# Patient Record
Sex: Female | Born: 1965 | Race: Black or African American | Hispanic: No | Marital: Married
Health system: Southern US, Community
[De-identification: ages and names within clinical notes are randomized; demographics above are authoritative.]

## PROBLEM LIST (undated history)

## (undated) DIAGNOSIS — I10 Essential (primary) hypertension: Secondary | ICD-10-CM

## (undated) DIAGNOSIS — I471 Supraventricular tachycardia, unspecified: Secondary | ICD-10-CM

## (undated) DIAGNOSIS — I82A19 Acute embolism and thrombosis of unspecified axillary vein: Secondary | ICD-10-CM

---

## 2020-03-13 ENCOUNTER — Other Ambulatory Visit: Payer: Self-pay

## 2020-03-13 ENCOUNTER — Emergency Department: Payer: Managed Care, Other (non HMO)

## 2020-03-13 ENCOUNTER — Emergency Department
Admission: EM | Admit: 2020-03-13 | Discharge: 2020-03-13 | Disposition: A | Discharge status: Home / Self Care | Payer: Managed Care, Other (non HMO) | Attending: Emergency Medicine | Admitting: Emergency Medicine

## 2020-03-13 ENCOUNTER — Ambulatory Visit
Admission: EM | Admit: 2020-03-13 | Discharge: 2020-03-13 | Discharge status: Absent without leave | Payer: Managed Care, Other (non HMO) | Source: Home / Self Care

## 2020-03-13 DIAGNOSIS — M79662 Pain in left lower leg: Secondary | ICD-10-CM

## 2020-03-13 DIAGNOSIS — L03116 Cellulitis of left lower limb: Secondary | ICD-10-CM | POA: Diagnosis not present

## 2020-03-13 DIAGNOSIS — I10 Essential (primary) hypertension: Secondary | ICD-10-CM | POA: Insufficient documentation

## 2020-03-13 DIAGNOSIS — Z86718 Personal history of other venous thrombosis and embolism: Secondary | ICD-10-CM

## 2020-03-13 DIAGNOSIS — M7989 Other specified soft tissue disorders: Secondary | ICD-10-CM

## 2020-03-13 HISTORY — DX: Supraventricular tachycardia, unspecified: I47.10

## 2020-03-13 HISTORY — DX: Essential (primary) hypertension: I10

## 2020-03-13 HISTORY — DX: Supraventricular tachycardia: I47.1

## 2020-03-13 HISTORY — DX: Acute embolism and thrombosis of unspecified axillary vein: I82.A19

## 2020-03-13 MED ORDER — CEPHALEXIN 500 MG PO CAPS: 500.0000 mg | ORAL_CAPSULE | Freq: Three times a day (TID) | ORAL | 0 refills | Status: AC

## 2020-03-13 MED ORDER — IBUPROFEN 600 MG PO TABS
600.0000 mg | ORAL_TABLET | Freq: Four times a day (QID) | ORAL | 0 refills | Status: AC | PRN
Start: 2020-03-13 — End: ?

## 2020-03-13 NOTE — Discharge Instructions (Signed)
There is no evidence of a blood clot on your ultrasound.  Please take ibuprofen for inflammation and antibiotics for any possible infection.  Return to the emergency department in 1-2 days if symptoms worsen.

## 2020-03-13 NOTE — ED Triage Notes (Signed)
Pt here for left leg swelling that started a few days ago. Pt has a hx of blood clots and states that her leg is warm and tender to the touch and wanted to make sure that it was not a clot.

## 2020-03-13 NOTE — ED Provider Notes (Signed)
[facility] Emergency Department Provider Note  ____________________________________________  Time seen: Approximately 6:43 PM  I have reviewed the triage vital signs and the nursing notes.   HISTORY  Chief Complaint Leg Swelling    HPI Brittney Mills is a 54 y.o. female that presents to the emergency department for evaluation of left calf tenderness for 2 days.  Patient has a history of a DVT.  She is no longer on any anticoagulants.  Patient drove from [place] to [place] this week to be with her grandkids.  She does not smoke.  No recent surgeries.  Patient does not recall getting bitten by an insect.  No fevers.   Past Medical History:  Diagnosis Date  . DVT of axillary vein, acute ([place])   . Hypertension   . SVT (supraventricular tachycardia) ([facility])     There are no problems to display for this patient.   Prior to Admission medications   Medication Sig Start Date End Date Taking? Authorizing Provider  cephALEXin (KEFLEX) 500 MG capsule Take 1 capsule (500 mg total) by mouth 3 (three) times daily for 7 days. 03/13/20 03/20/20  Laban Emperor, PA-C  ibuprofen (ADVIL) 600 MG tablet Take 1 tablet (600 mg total) by mouth every 6 (six) hours as needed. 03/13/20   Laban Emperor, PA-C    Allergies Chocolate and Lisinopril  No family history on file.  Social History Social History   Tobacco Use  . Smoking status: Not on file  Substance Use Topics  . Alcohol use: Not on file  . Drug use: Not on file     Review of Systems  Constitutional: No fever/chills Cardiovascular: No chest pain. Respiratory: No cough. No SOB. Gastrointestinal: No abdominal pain.  No nausea, no vomiting.  Musculoskeletal: Positive for calf pain. Skin: Negative for abrasions, lacerations, ecchymosis.  Positive for rash. Neurological: Negative for headaches   ____________________________________________   PHYSICAL EXAM:  VITAL SIGNS: ED Triage Vitals  Enc  Vitals Group     BP 03/13/20 1616 (!) 161/96     Pulse Rate 03/13/20 1616 87     Resp 03/13/20 1616 16     Temp 03/13/20 1616 98 F (36.7 C)     Temp Source 03/13/20 1616 Oral     SpO2 03/13/20 1616 99 %     Weight 03/13/20 1616 220 lb (99.8 kg)     Height 03/13/20 1616 5\' 2"  (1.575 m)     Head Circumference --      Peak Flow --      Pain Score 03/13/20 1620 3     Pain Loc --      Pain Edu? --      Excl. in [place]? --      Constitutional: Alert and oriented. Well appearing and in no acute distress. Eyes: Conjunctivae are normal. PERRL. EOMI. Head: Atraumatic. ENT:      Ears:      Nose: No congestion/rhinnorhea.      Mouth/Throat: Mucous membranes are moist.  Neck: No stridor. Cardiovascular: Normal rate, regular rhythm.  Good peripheral circulation. Respiratory: Normal respiratory effort without tachypnea or retractions. Lungs CTAB. Good air entry to the bases with no decreased or absent breath sounds. Gastrointestinal: Bowel sounds 4 quadrants. Soft and nontender to palpation. No guarding or rigidity. No palpable masses. No distention. No CVA  Musculoskeletal: Full range of motion to all extremities. No gross deformities appreciated. Neurologic:  Normal speech and language. No gross focal neurologic deficits are appreciated.  Skin:  Skin is warm, dry and intact.  Mild tenderness and erythema to left proximal calf. Psychiatric: Mood and affect are normal. Speech and behavior are normal. Patient exhibits appropriate insight and judgement.   ____________________________________________   LABS (all labs ordered are listed, but only abnormal results are displayed)  Labs Reviewed - No data to display ____________________________________________  EKG   ____________________________________________  RADIOLOGY Lexine Baton, personally viewed and evaluated these images (plain radiographs) as part of my medical decision making, as well as reviewing the written report by the  radiologist.  US Venous Img Lower Unilateral Left (DVT)  Result Date: 03/13/2020 CLINICAL DATA:  Pain and swelling in LEFT leg for 2 days, history of deep venous thrombosis EXAM: LEFT LOWER EXTREMITY VENOUS DOPPLER ULTRASOUND TECHNIQUE: Gray-scale sonography with compression, as well as color and duplex ultrasound, were performed to evaluate the deep venous system(s) from the level of the common femoral vein through the popliteal and proximal calf veins. COMPARISON:  None FINDINGS: VENOUS Normal compressibility of the common femoral, superficial femoral, and popliteal veins, as well as the visualized calf veins. Visualized portions of profunda femoral vein and great saphenous vein unremarkable. No filling defects to suggest DVT on grayscale or color Doppler imaging. Doppler waveforms show normal direction of venous flow, normal respiratory plasticity and response to augmentation. Limited views of the contralateral common femoral vein are unremarkable. OTHER None. Limitations: none IMPRESSION: No evidence of deep venous thrombosis in the LEFT lower extremity. Electronically Signed   By: Ulyses Southward M.D.   On: 03/13/2020 18:07    ____________________________________________    PROCEDURES  Procedure(s) performed:    Procedures    Medications - No data to display   ____________________________________________   INITIAL IMPRESSION / ASSESSMENT AND PLAN / ED COURSE  Pertinent labs & imaging results that were available during my care of the patient were reviewed by me and considered in my medical decision making (see chart for details).  Review of the [place] CSRS was performed in accordance of the [facility] prior to dispensing any controlled drugs.   Patient presented to emergency department for evaluation of left calf pain and swelling for 2 days.  Ultrasound is negative for DVT.  Patient will be started on ibuprofen for inflammation and Keflex for possible cellulitis.  Patient will be discharged  home with prescriptions for Keflex.  Patient is to follow up with primary care as directed. Patient is given ED precautions to return to the ED for any worsening or new symptoms.  Brittney Mills was evaluated in Emergency Department on 03/13/2020 for the symptoms described in the history of present illness. She was evaluated in the context of the global COVID-19 pandemic, which necessitated consideration that the patient might be at risk for infection with the SARS-CoV-2 virus that causes COVID-19. Institutional protocols and algorithms that pertain to the evaluation of patients at risk for COVID-19 are in a state of rapid change based on information released by regulatory bodies including the CDC and federal and state organizations. These policies and algorithms were followed during the patient's care in the ED.   ____________________________________________  FINAL CLINICAL IMPRESSION(S) / ED DIAGNOSES  Final diagnoses:  Pain of left calf  Cellulitis of left lower extremity      NEW MEDICATIONS STARTED DURING THIS VISIT:  ED Discharge Orders         Ordered    cephALEXin (KEFLEX) 500 MG capsule  3 times daily     03/13/20 1934    ibuprofen (ADVIL)  600 MG tablet  Every 6 hours PRN     03/13/20 1934              This chart was dictated using voice recognition software/Dragon. Despite best efforts to proofread, errors can occur which can change the meaning. Any change was purely unintentional.    Enid Derry, PA-C 03/13/20 2302    Shaune Pollack, MD 03/14/20 [phone]

## 2021-10-02 IMAGING — US US EXTREM LOW VENOUS*L*
1 series · 14 of 24 positions shown · non-contrast
Comparison: None

CLINICAL DATA: Pain and swelling in LEFT leg for 2 days, history of
deep venous thrombosis

EXAM:
LEFT LOWER EXTREMITY VENOUS DOPPLER ULTRASOUND
TECHNIQUE: Gray-scale sonography with compression, as well as color and duplex
ultrasound, were performed to evaluate the deep venous system(s)
from the level of the common femoral vein through the popliteal and
proximal calf veins.

[Series 1: us venous img lower uni left (dvt) · portal-venous · 14 of 33 slices shown]
[im 1/33]
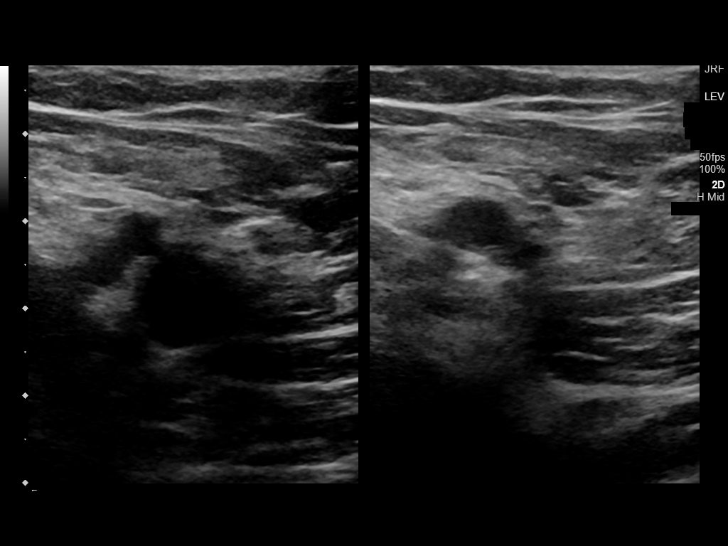
[im 3/33]
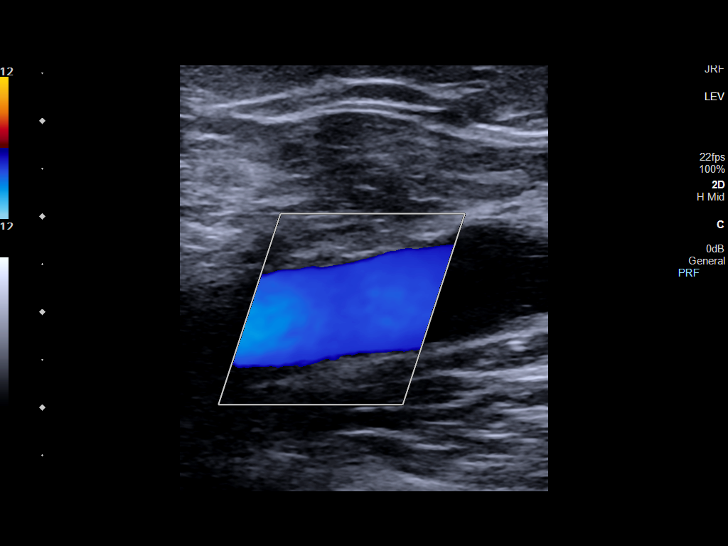
[im 6/33]
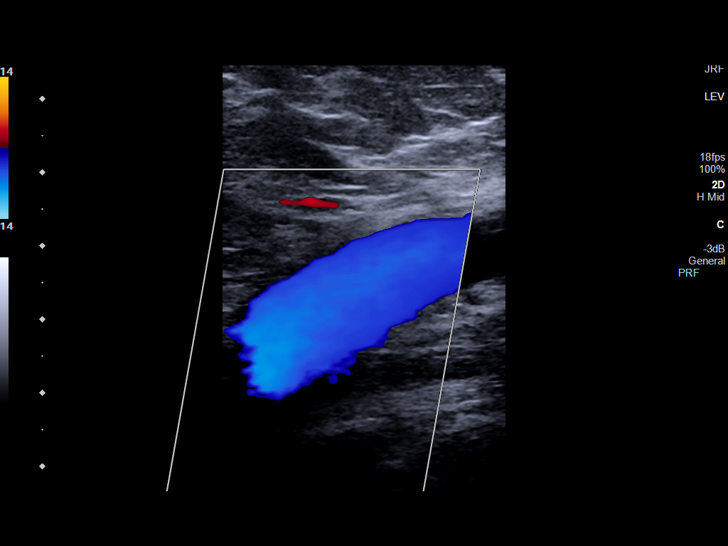
[im 9/33]
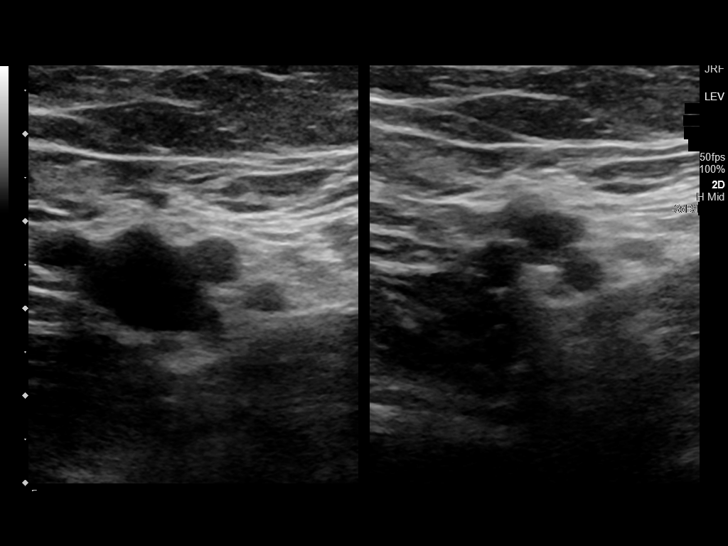
[im 10/33]
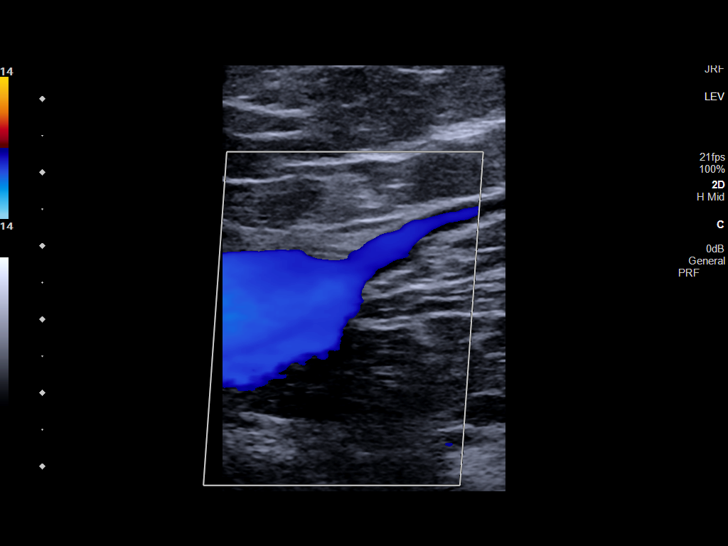
[im 13/33]
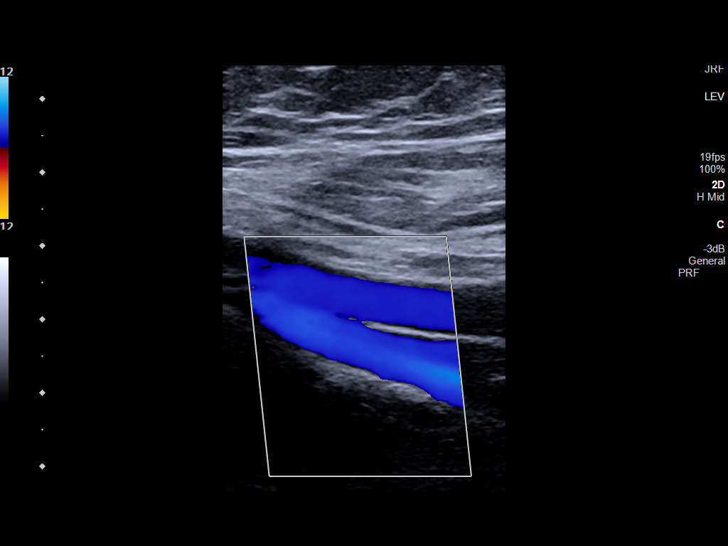
[im 16/33]
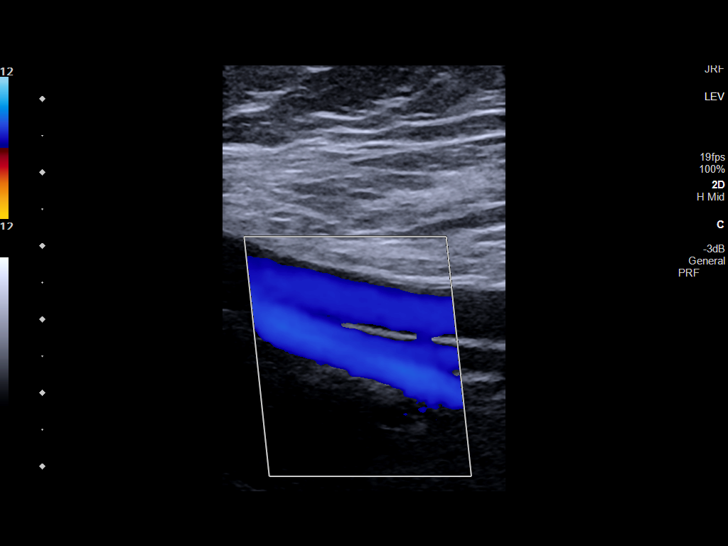
[im 17/33]
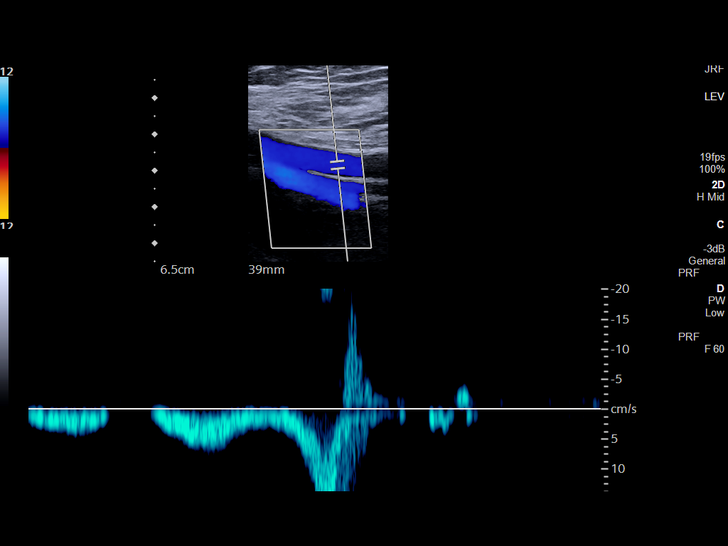
[im 20/33]
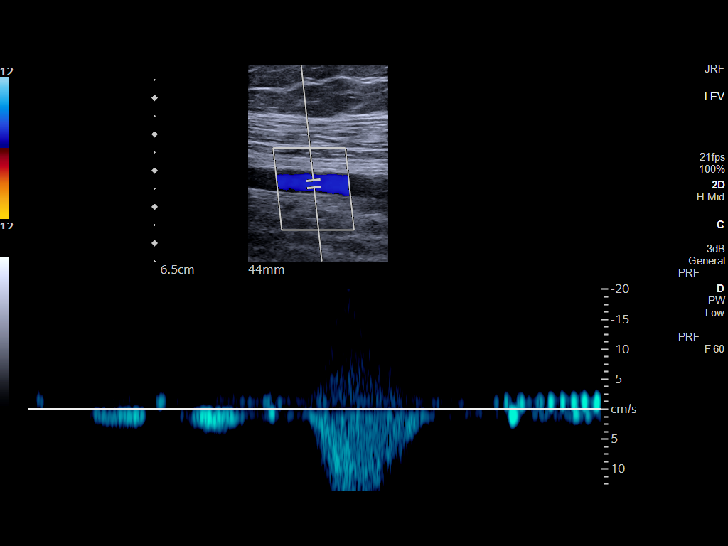
[im 23/33]
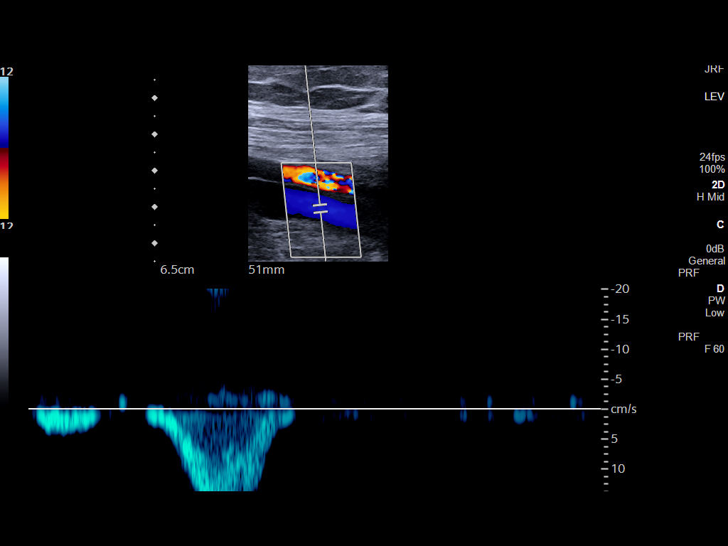
[im 26/33]
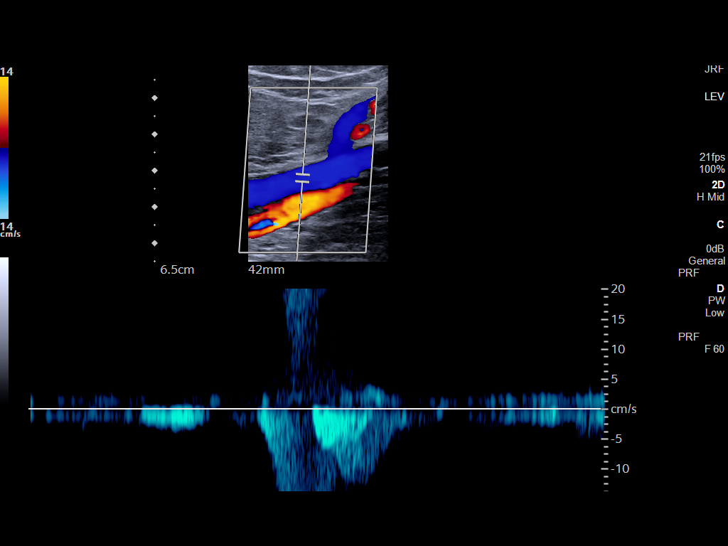
[im 27/33]
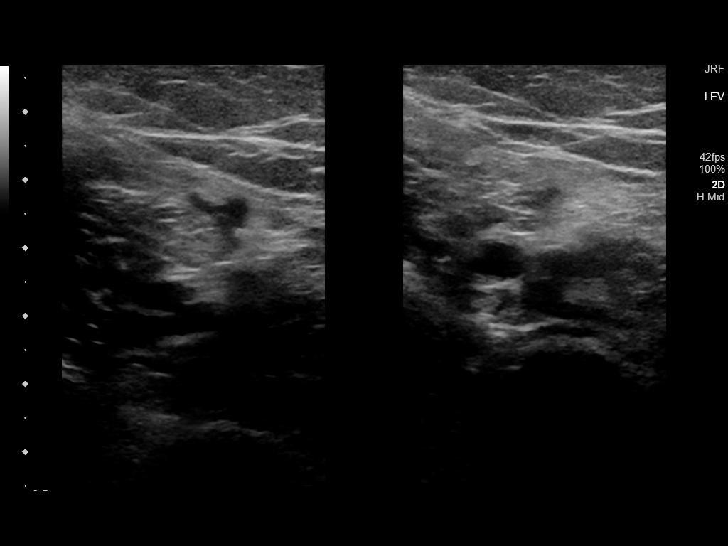
[im 30/33]
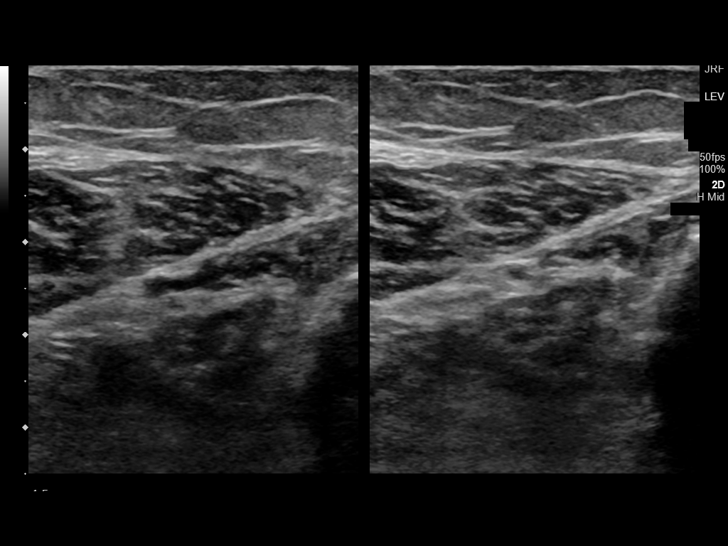
[im 33/33]
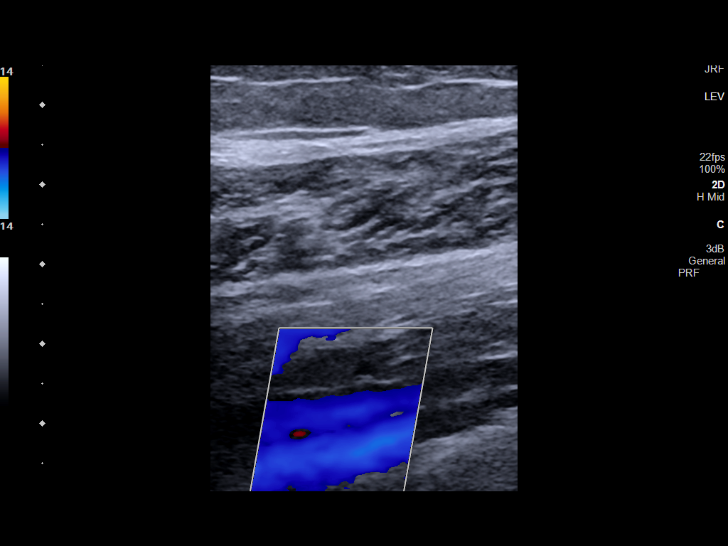

[14 of 24 positions shown; findings below may reference images not displayed]

FINDINGS: VENOUS

Normal compressibility of the common femoral, superficial femoral,
and popliteal veins, as well as the visualized calf veins.
Visualized portions of profunda femoral vein and great saphenous
vein unremarkable. No filling defects to suggest DVT on grayscale or
color Doppler imaging. Doppler waveforms show normal direction of
venous flow, normal respiratory plasticity and response to
augmentation.

Limited views of the contralateral common femoral vein are
unremarkable.

OTHER

None.

Limitations: none
IMPRESSION: No evidence of deep venous thrombosis in the LEFT lower extremity.
